# Patient Record
Sex: Female | Born: 1980 | Hispanic: Yes | Marital: Single | Smoking: Never smoker
Health system: Southern US, Community
[De-identification: ages and names within clinical notes are randomized; demographics above are authoritative.]

## PROBLEM LIST (undated history)

## (undated) DIAGNOSIS — J45909 Unspecified asthma, uncomplicated: Secondary | ICD-10-CM

---

## 2016-03-06 ENCOUNTER — Emergency Department: Payer: PRIVATE HEALTH INSURANCE

## 2016-03-06 ENCOUNTER — Emergency Department
Admission: EM | Admit: 2016-03-06 | Discharge: 2016-03-06 | Disposition: A | Discharge status: Home / Self Care | Payer: PRIVATE HEALTH INSURANCE | Attending: Emergency Medicine | Admitting: Emergency Medicine

## 2016-03-06 ENCOUNTER — Encounter: Payer: Self-pay | Admitting: Emergency Medicine

## 2016-03-06 DIAGNOSIS — O209 Hemorrhage in early pregnancy, unspecified: Secondary | ICD-10-CM

## 2016-03-06 DIAGNOSIS — O039 Complete or unspecified spontaneous abortion without complication: Secondary | ICD-10-CM | POA: Diagnosis not present

## 2016-03-06 LAB — CBC
HCT: 39.6 % (ref 35.0–47.0)
HEMOGLOBIN: 13.3 g/dL (ref 12.0–16.0)
MCH: 30.6 pg (ref 26.0–34.0)
MCHC: 33.6 g/dL (ref 32.0–36.0)
MCV: 91.1 fL (ref 80.0–100.0)
PLATELETS: 176 10*3/uL (ref 150–440)
RBC: 4.35 MIL/uL (ref 3.80–5.20)
RDW: 13.6 % (ref 11.5–14.5)
WBC: 9.1 10*3/uL (ref 3.6–11.0)

## 2016-03-06 LAB — ABO/RH: ABO/RH(D): A POS

## 2016-03-06 LAB — HCG, QUANTITATIVE, PREGNANCY: HCG, BETA CHAIN, QUANT, S: 1 m[IU]/mL (ref <5)

## 2016-03-06 NOTE — ED Provider Notes (Signed)
[facility] Emergency Department Provider Note  ____________________________________________  Time seen: 5:50 PM  I have reviewed the triage vital signs and the nursing notes.   HISTORY  Chief Complaint Vaginal bleeding  Patient interviewed and examined with Spanish interpreter at bedside   HPI Tammy Curry is a 35 y.o. female who reports being 5-[redacted] weeks pregnant based on last menstrual period of April 23. She took pregnancy tests at home which were positive 2. Yesterday she started having vaginal bleeding with pelvic cramping. The pain hurts in her back as well. No chest pain shortness of breath or fevers. No unusual vaginal discharge. No urinary complaints. She is going through about 3 pads in a day. She has noticed some blood clots as well.  She is G5 P3 including the current pregnancy with 1 prior miscarriage.     History reviewed. No pertinent past medical history.   There are no active problems to display for this patient.    No past surgical history on file. None  No current outpatient prescriptions on file. None  Allergies Review of patient's allergies indicates no known allergies.   History reviewed. No pertinent family history.  Social History Social History  Substance Use Topics  . Smoking status: None  . Smokeless tobacco: None  . Alcohol Use: None    Review of Systems  Constitutional:   No fever or chills.  Eyes:   No vision changes.  ENT:   No sore throat. No rhinorrhea. Cardiovascular:   No chest pain. Respiratory:   No dyspnea or cough. Gastrointestinal:   Negative for abdominal pain, vomiting and diarrhea.  Genitourinary:   Pelvic pain with vaginal bleeding.. Musculoskeletal:   Negative for focal pain or swelling Neurological:   Negative for headaches 10-point ROS otherwise negative.  ____________________________________________   PHYSICAL EXAM:  VITAL SIGNS: ED Triage Vitals  Enc Vitals Group      BP 03/06/16 1455 110/64 mmHg     Pulse Rate 03/06/16 1455 93     Resp 03/06/16 1455 17     Temp 03/06/16 1455 98.7 F (37.1 C)     Temp Source 03/06/16 1455 Oral     SpO2 03/06/16 1455 98 %     Weight 03/06/16 1455 215 lb (97.523 kg)     Height 03/06/16 1455  (1.6 m)     Head Cir --      Peak Flow --      Pain Score 03/06/16 1456 7     Pain Loc --      Pain Edu? --      Excl. in GC? --     Vital signs reviewed, nursing assessments reviewed.   Constitutional:   Alert and oriented. Well appearing and in no distress. Eyes:   No scleral icterus. No conjunctival pallor. PERRL. EOMI.  No nystagmus. ENT   Head:   Normocephalic and atraumatic.   Nose:   No congestion/rhinnorhea. No septal hematoma   Mouth/Throat:   MMM, no pharyngeal erythema. No peritonsillar mass.    Neck:   No stridor. No SubQ emphysema. No meningismus. Hematological/Lymphatic/Immunilogical:   No cervical lymphadenopathy. Cardiovascular:   RRR. Symmetric bilateral radial and DP pulses.  No murmurs.  Respiratory:   Normal respiratory effort without tachypnea nor retractions. Breath sounds are clear and equal bilaterally. No wheezes/rales/rhonchi. Gastrointestinal:   Soft and nontender. Non distended. There is no CVA tenderness.  No rebound, rigidity, or guarding. Genitourinary:   deferred Musculoskeletal:   Nontender with normal  range of motion in all extremities. No joint effusions.  No lower extremity tenderness.  No edema. Neurologic:   Normal speech and language.  CN 2-10 normal. Motor grossly intact. No gross focal neurologic deficits are appreciated.  Skin:    Skin is warm, dry and intact. No rash noted.  No petechiae, purpura, or bullae.  ____________________________________________    LABS (pertinent positives/negatives) (all labs ordered are listed, but only abnormal results are displayed) Labs Reviewed  CBC  HCG, QUANTITATIVE, PREGNANCY  ABO/RH    ____________________________________________   EKG    ____________________________________________    RADIOLOGY  Ultrasound pelvis is unremarkable, no abnormalities, no intrauterine gestation or adnexal masses.  ____________________________________________   PROCEDURES   ____________________________________________   INITIAL IMPRESSION / ASSESSMENT AND PLAN / ED COURSE  Pertinent labs & imaging results that were available during my care of the patient were reviewed by me and considered in my medical decision making (see chart for details).  Patient presents with vaginal bleeding and cramping, first trimester pregnancy 5-[redacted] weeks along. A positive blood type, labs unremarkable, ultrasound negative for evidence of pregnancy. HCG level is 1 which is highly suspicious for a completed miscarriage. However, patient was instructed to follow up with primary care or gynecology for repeat hCG testing and further evaluation in 3 days. Return precautions for any severe pain syncope vomiting or fever.     ____________________________________________   FINAL CLINICAL IMPRESSION(S) / ED DIAGNOSES  Final diagnoses:  Vaginal bleeding in pregnancy, first trimester  Miscarriage       Portions of this note were generated with dragon dictation software. Dictation errors may occur despite best attempts at proofreading.   Sharman CheekPhillip Yani Coventry, MD 03/06/16 2137

## 2016-03-06 NOTE — ED Notes (Signed)
Interpreter, Dr. Scotty CourtStafford and this RN at bedside speaking to patient.

## 2016-03-06 NOTE — Discharge Instructions (Signed)
Your ultrasound of the pelvis is unable to identify a pregnancy anywhere inside or outside of the uterus. Your pregnancy hormone level today is 1. Because of this, and your symptoms, it is very likely that you have had a miscarriage already. Please follow-up with your primary care doctor or gynecology in 3 days for recheck of your beta hCG level and further evaluation. Your blood type is A+.  Aborto espontneo  (Miscarriage) El aborto espontneo es la prdida de un beb que no ha nacido (feto) antes de la semana 20 del Psychiatrist. La mayor parte de estos abortos ocurre en los primeros 3 meses. En algunos casos ocurre antes de que la mujer sepa que est [place]. Tambin se denomina "aborto espontneo" o "prdida prematura del embarazo". El aborto espontneo puede ser Neomia Dear experiencia que afecte emocionalmente a Dealer. Converse con su mdico si tiene dudas, cmo es el proceso de [place], y sobre planes futuros de Psychiatrist.  CAUSAS   Algunos problemas cromosmicos pueden hacer imposible que el beb se desarrolle normalmente. Los problemas con los genes o cromosomas del beb son generalmente el resultado de errores que se producen, por casualidad, cuando el embrin se divide y crece. Estos problemas no se heredan de los U[place].  Infeccin en el cuello del tero.   Problemas hormonales.   Problemas en el cuello del tero, como tener un tero incompetente. Esto ocurre cuando los tejidos no son lo suficientemente fuertes como para Arts administrator.   Problemas del tero, como un tero con forma anormal, los fibromas o anormalidades congnitas.   Ciertas enfermedades crnicas.   No fume, no beba alcohol, ni consuma drogas.   Traumatismos  A veces, la causa es desconocida.  SNTOMAS   Sangrado o manchado vaginal, con o sin clicos o dolor.  Dolor o clicos en el abdomen o en la cintura.  Eliminacin de lquido, tejidos o cogulos grandes por la vagina. DIAGNSTICO  El [employer]  har un examen fsico. Tambin le indicar una ecografa para confirmar el aborto. Es posible que se realicen anlisis de [place].  TRATAMIENTO   En algunos casos el tratamiento no es necesario, si se eliminan naturalmente todos los tejidos embrionarios que se encontraban en el tero. Si el feto o la placenta quedan dentro del tero (aborto incompleto), pueden infectarse, los tejidos que quedan pueden infectarse y deben retirarse. Generalmente se realiza un procedimiento de dilatacin y curetaje (D y C). Durante el procedimiento de dilatacin y curetaje, el cuello del tero se abre (dilata) y se retira cualquier resto de tejido fetal o placentario del tero.  Si hay una infeccin, le recetarn antibiticos. Podrn recetarle otros medicamentos para reducir el tamao del tero (contraerlo) si hay una mucho sangrado.  Si su sangre es Rh negativa y su beb es Rh positivo, usted necesitar la inyeccin de inmunoglobulina Rh. Esta inyeccin proteger a los futuros bebs de tener problemas de compatibilidad Rh en futuros embarazos. INSTRUCCIONES PARA EL CUIDADO EN EL HOGAR   El mdico le indicar reposo en cama o le permitir Dance movement psychotherapist. Vuelva a la actividad lentamente o segn las indicaciones de su mdico.  Pdale a alguien que la ayude con las responsabilidades familiares y del hogar durante este tiempo.   Lleve un registro de la cantidad y la saturacin de las toallas higinicas que Landscape architect. Anote esta informacin   No use tampones. No No se haga duchas vaginales ni tenga relaciones sexuales hasta que el mdico la autorice.   Slo  tome medicamentos de venta libre o recetados para Primary school teachercalmar el dolor o Environmental health practitionerel malestar, segn las indicaciones de su mdico.   No tome aspirina. La aspirina puede ocasionar hemorragias.   Concurra puntualmente a las citas de control con el mdico.   Si usted o su pareja tienen dificultades con el duelo, hable con su mdico para buscar la Pepco Holdingsayuda  psicolgica que los ayude a Runner, broadcasting/film/videoenfrentar la prdida del Psychiatristembarazo. Permtase el tiempo suficiente de duelo antes de quedar embarazada nuevamente.  SOLICITE ATENCIN MDICA DE INMEDIATO SI:   Siente calambres intensos o dolor en la espalda o en el abdomen.  Tiene fiebre.  Elimina grandes cogulos de Lumber Citysangre (del tamao de una nuez o ms) o tejidos por la vagina. Guarde lo que ha eliminado para que su mdico lo examine.   La hemorragia aumenta.   Brett Fairybserva una secrecin vaginal espesa y con mal olor.  Se siente mareada, dbil, o se desmaya.   Siente escalofros.  ASEGRESE DE QUE:   Comprende estas instrucciones.  Controlar su enfermedad.  Solicitar ayuda de inmediato si no mejora o si empeora.   Esta informacin no tiene Theme park managercomo fin reemplazar el consejo del mdico. Asegrese de hacerle al mdico cualquier pregunta que tenga.   Document Released: 07/01/2005 Document Revised: 01/16/2013 Elsevier Interactive Patient Education 2016 [employer]  Hemorragia vaginal durante Firefighterel embarazo (primer trimestre) (Vaginal Bleeding During Pregnancy, First Trimester) Durante los primeros meses del embarazo es relativamente frecuente que se presente una pequea hemorragia (manchas). Esta situacin generalmente mejora por s misma. Estas hemorragias o manchas tienen diversas causas al inicio del embarazo. Algunas manchas pueden estar relacionadas al Big Lotsembarazo y otras no. En la International Business Machinesmayora de los casos, la hemorragia es normal y no es un problema. Sin embargo, la hemorragia tambin puede ser un signo de algo grave. Debe informar a su mdico de inmediato si tiene alguna hemorragia vaginal. Algunas causas posibles de hemorragia vaginal durante el primer trimestre incluyen:  Infeccin o inflamacin del cuello del tero.  Crecimientos anormales (plipos) en el cuello del tero.  Aborto espontneo o amenaza de aborto espontneo.  Tejido del Psychiatristembarazo se ha desarrollado fuera del tero y en una trompa de  falopio (embarazo ectpico).  Se han desarrollado pequeos quistes en el tero en lugar de tejido de embarazo (embarazo molar). INSTRUCCIONES PARA EL CUIDADO EN EL HOGAR  Controle su afeccin para ver si hay cambios. Las siguientes indicaciones ayudarn a Psychologist, educationalaliviar cualquier Longs Drug Storesmolestia que pueda sentir:  Siga las indicaciones del mdico para restringir su actividad. Si el mdico le indica descanso en la cama, debe quedarse en la cama y levantarse solo para ir al bao. No obstante, el mdico puede permitirle que continu con tareas livianas.  Si es necesario, organcese para que alguien le ayude con las actividades y responsabilidades cotidianas mientras est en cama.  Lleve un registro de la cantidad y la saturacin de las toallas higinicas que Landscape architectutiliza cada da. Anote este dato.  No use tampones.No se haga duchas vaginales.  No tenga relaciones sexuales u orgasmos hasta que el mdico la autorice.  Si elimina tejido por la vagina, gurdelo para mostrrselo al American Expressmdico.  Holdenome solo medicamentos de venta libre o recetados, segn las indicaciones del mdico.  No tome aspirina, ya que puede causar hemorragias.  Cumpla con todas las visitas de control, segn le indique su mdico. SOLICITE ATENCIN MDICA SI:  Tiene un sangrado vaginal en cualquier momento del embarazo.  Tiene calambres o dolores de Carlisleparto.  Tiene American International Groupfiebre que los  medicamentos no pueden controlar. SOLICITE ATENCIN MDICA DE INMEDIATO SI:   Siente calambres intensos en la espalda o en el vientre (abdomen).  Elimina cogulos grandes o tejido por la vagina.  La hemorragia aumenta.  Si se siente mareada, dbil o se desmaya.  Tiene escalofros.  Tiene una prdida importante o sale lquido a borbotones por la vagina.  Se desmaya mientras defeca. ASEGRESE DE QUE:  Comprende estas instrucciones.  Controlar su afeccin.  Recibir ayuda de inmediato si no mejora o si empeora.   Esta informacin no tiene Microbiologist el consejo del mdico. Asegrese de hacerle al mdico cualquier pregunta que tenga.   Document Released: 07/01/2005 Document Revised: 09/26/2013 Elsevier Interactive Patient Education [employer]

## 2016-03-06 NOTE — ED Notes (Signed)
Patient is 5 weeks 3 days pregnant. No prenatal care established at this time. [facility] with c/o lightheadedness, and slow but consistent vaginal bleeding with abdominal pain/back pain.

## 2016-03-06 NOTE — ED Notes (Signed)
Interpreter and this RN at bedside. Discharge instructions reviewed. Questions answered. No further needs at this time.

## 2018-05-20 ENCOUNTER — Emergency Department: Payer: PRIVATE HEALTH INSURANCE

## 2018-05-20 ENCOUNTER — Emergency Department
Admission: EM | Admit: 2018-05-20 | Discharge: 2018-05-21 | Disposition: A | Discharge status: Home / Self Care | Payer: PRIVATE HEALTH INSURANCE | Attending: Emergency Medicine | Admitting: Emergency Medicine

## 2018-05-20 DIAGNOSIS — M25511 Pain in right shoulder: Secondary | ICD-10-CM | POA: Diagnosis not present

## 2018-05-20 DIAGNOSIS — Y9389 Activity, other specified: Secondary | ICD-10-CM | POA: Diagnosis not present

## 2018-05-20 DIAGNOSIS — S199XXA Unspecified injury of neck, initial encounter: Secondary | ICD-10-CM | POA: Diagnosis present

## 2018-05-20 DIAGNOSIS — Y998 Other external cause status: Secondary | ICD-10-CM | POA: Insufficient documentation

## 2018-05-20 DIAGNOSIS — S161XXA Strain of muscle, fascia and tendon at neck level, initial encounter: Secondary | ICD-10-CM | POA: Diagnosis not present

## 2018-05-20 DIAGNOSIS — R0789 Other chest pain: Secondary | ICD-10-CM | POA: Diagnosis not present

## 2018-05-20 DIAGNOSIS — Y92411 Interstate highway as the place of occurrence of the external cause: Secondary | ICD-10-CM | POA: Insufficient documentation

## 2018-05-20 MED ORDER — LIDOCAINE HCL (PF) 1 % IJ SOLN
5.0000 mL | Freq: Once | INTRAMUSCULAR | Status: DC
  Filled 2018-05-20: qty 5

## 2018-05-20 MED ORDER — HYDROCODONE-ACETAMINOPHEN 5-325 MG PO TABS: 1.0000 | ORAL_TABLET | Freq: Four times a day (QID) | ORAL | 0 refills | Status: DC | PRN

## 2018-05-20 MED ORDER — HYDROCODONE-ACETAMINOPHEN 5-325 MG PO TABS
2.0000 | ORAL_TABLET | Freq: Once | ORAL | Status: AC
  Administered 2018-05-20: 2 via ORAL
  Filled 2018-05-20: qty 2

## 2018-05-20 MED ORDER — IBUPROFEN 600 MG PO TABS
600.0000 mg | ORAL_TABLET | Freq: Once | ORAL | Status: AC
  Administered 2018-05-20: 600 mg via ORAL
  Filled 2018-05-20: qty 1

## 2018-05-20 MED ORDER — IBUPROFEN 600 MG PO TABS: 600.0000 mg | ORAL_TABLET | Freq: Three times a day (TID) | ORAL | 0 refills | Status: DC | PRN
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
## 2018-07-05 ENCOUNTER — Ambulatory Visit
Admission: RE | Admit: 2018-07-05 | Discharge: 2018-07-05 | Disposition: A | Discharge status: Home / Self Care | Payer: BLUE CROSS/BLUE SHIELD | Source: Ambulatory Visit | Attending: Chiropractic Medicine | Admitting: Chiropractic Medicine

## 2018-07-05 ENCOUNTER — Other Ambulatory Visit: Payer: Self-pay | Admitting: Chiropractic Medicine

## 2018-07-05 DIAGNOSIS — M545 Low back pain, unspecified: Secondary | ICD-10-CM

## 2019-12-19 ENCOUNTER — Emergency Department: Payer: BC Managed Care – PPO

## 2019-12-19 ENCOUNTER — Other Ambulatory Visit: Payer: Self-pay

## 2019-12-19 ENCOUNTER — Emergency Department
Admission: EM | Admit: 2019-12-19 | Discharge: 2019-12-19 | Disposition: A | Discharge status: Home / Self Care | Payer: BC Managed Care – PPO | Attending: Emergency Medicine | Admitting: Emergency Medicine

## 2019-12-19 DIAGNOSIS — Y9389 Activity, other specified: Secondary | ICD-10-CM | POA: Insufficient documentation

## 2019-12-19 DIAGNOSIS — S199XXA Unspecified injury of neck, initial encounter: Secondary | ICD-10-CM | POA: Diagnosis present

## 2019-12-19 DIAGNOSIS — M25561 Pain in right knee: Secondary | ICD-10-CM | POA: Diagnosis not present

## 2019-12-19 DIAGNOSIS — S161XXA Strain of muscle, fascia and tendon at neck level, initial encounter: Secondary | ICD-10-CM | POA: Insufficient documentation

## 2019-12-19 DIAGNOSIS — J45909 Unspecified asthma, uncomplicated: Secondary | ICD-10-CM | POA: Diagnosis not present

## 2019-12-19 DIAGNOSIS — Y92410 Unspecified street and highway as the place of occurrence of the external cause: Secondary | ICD-10-CM | POA: Diagnosis not present

## 2019-12-19 DIAGNOSIS — Y999 Unspecified external cause status: Secondary | ICD-10-CM | POA: Insufficient documentation

## 2019-12-19 DIAGNOSIS — M25562 Pain in left knee: Secondary | ICD-10-CM | POA: Diagnosis not present

## 2019-12-19 DIAGNOSIS — R1012 Left upper quadrant pain: Secondary | ICD-10-CM | POA: Insufficient documentation

## 2019-12-19 DIAGNOSIS — M545 Low back pain: Secondary | ICD-10-CM | POA: Insufficient documentation

## 2019-12-19 DIAGNOSIS — R1010 Upper abdominal pain, unspecified: Secondary | ICD-10-CM

## 2019-12-19 HISTORY — DX: Unspecified asthma, uncomplicated: J45.909

## 2019-12-19 LAB — URINALYSIS, COMPLETE (UACMP) WITH MICROSCOPIC
Bilirubin Urine: NEGATIVE
Glucose, UA: NEGATIVE mg/dL
Ketones, ur: NEGATIVE mg/dL
Nitrite: NEGATIVE
Protein, ur: NEGATIVE mg/dL
Specific Gravity, Urine: 1.011 (ref 1.005–1.030)
pH: 5 (ref 5.0–8.0)

## 2019-12-19 LAB — POCT PREGNANCY, URINE: Preg Test, Ur: NEGATIVE

## 2019-12-19 MED ORDER — BACLOFEN 10 MG PO TABS: 10.0000 mg | ORAL_TABLET | Freq: Every day | ORAL | 1 refills | Status: AC
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
## 2023-07-25 ENCOUNTER — Other Ambulatory Visit: Payer: Self-pay

## 2023-07-25 ENCOUNTER — Emergency Department
Admission: EM | Admit: 2023-07-25 | Discharge: 2023-07-25 | Disposition: A | Discharge status: Home / Self Care | Payer: PRIVATE HEALTH INSURANCE | Attending: Emergency Medicine | Admitting: Emergency Medicine

## 2023-07-25 ENCOUNTER — Emergency Department: Payer: PRIVATE HEALTH INSURANCE

## 2023-07-25 DIAGNOSIS — R079 Chest pain, unspecified: Secondary | ICD-10-CM

## 2023-07-25 DIAGNOSIS — R0789 Other chest pain: Secondary | ICD-10-CM | POA: Insufficient documentation

## 2023-07-25 LAB — D-DIMER, QUANTITATIVE: D-Dimer, Quant: 0.5 ug{FEU}/mL (ref 0.00–0.50)

## 2023-07-25 LAB — BASIC METABOLIC PANEL
Anion gap: 12 (ref 5–15)
BUN: 10 mg/dL (ref 6–20)
CO2: 25 mmol/L (ref 22–32)
Calcium: 8.7 mg/dL — ABNORMAL LOW (ref 8.9–10.3)
Chloride: 103 mmol/L (ref 98–111)
Creatinine, Ser: 0.47 mg/dL (ref 0.44–1.00)
GFR, Estimated: >60 mL/min (ref >60)
Glucose, Bld: 164 mg/dL — ABNORMAL HIGH (ref 70–99)
Potassium: 3.3 mmol/L — ABNORMAL LOW (ref 3.5–5.1)
Sodium: 140 mmol/L (ref 135–145)

## 2023-07-25 LAB — CBC
HCT: 44.3 % (ref 36.0–46.0)
Hemoglobin: 14.3 g/dL (ref 12.0–15.0)
MCH: 29.6 pg (ref 26.0–34.0)
MCHC: 32.3 g/dL (ref 30.0–36.0)
MCV: 91.7 fL (ref 80.0–100.0)
Platelets: 251 10*3/uL (ref 150–400)
RBC: 4.83 MIL/uL (ref 3.87–5.11)
RDW: 13.2 % (ref 11.5–15.5)
WBC: 12.8 10*3/uL — ABNORMAL HIGH (ref 4.0–10.5)
nRBC: 0 % (ref 0.0–0.2)

## 2023-07-25 LAB — TROPONIN I (HIGH SENSITIVITY)
Troponin I (High Sensitivity): 3 ng/L (ref <18)
Troponin I (High Sensitivity): 8 ng/L (ref <18)

## 2023-07-25 MED ORDER — LORAZEPAM 1 MG PO TABS
1.0000 mg | ORAL_TABLET | Freq: Once | ORAL | Status: AC
  Administered 2023-07-25: 1 mg via ORAL
  Filled 2023-07-25: qty 1
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
## 2023-07-25 NOTE — ED Triage Notes (Signed)
Pt comes with c/o cp that woke her up. Pt states the pain is just making her feel different. Pt states she feels her heart is racing. Pt states more on left side. Pt denies any radiation. Pt states some vomiting. Pt denies any nausea or sob.

## 2024-07-03 ENCOUNTER — Encounter: Payer: Self-pay | Admitting: Nurse Practitioner
